# Patient Record
Sex: Male | Born: 1995 | Race: White | Hispanic: No | Marital: Single | Smoking: Never smoker
Health system: Southern US, Community
[De-identification: ages and names within clinical notes are randomized; demographics above are authoritative.]

---
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
## 2016-01-10 ENCOUNTER — Other Ambulatory Visit: Payer: Self-pay

## 2016-01-10 MED ORDER — BROMOCRIPTINE MESYLATE 2.5 MG PO TABS: 2.5000 mg | ORAL_TABLET | Freq: Every day | ORAL | 2 refills | Status: DC

## 2016-01-23 ENCOUNTER — Telehealth: Payer: Self-pay | Admitting: Endocrinology

## 2016-01-23 MED ORDER — BROMOCRIPTINE MESYLATE 2.5 MG PO TABS: 2.5000 mg | ORAL_TABLET | Freq: Every day | ORAL | 2 refills | Status: DC

## 2016-01-23 NOTE — Telephone Encounter (Signed)
Patient need refill of bromocriptine (PARLODEL) 2.5 MG tablet  Send to  [facility] [place] - [address] [phone] (Phone) [phone] (Fax)

## 2016-01-23 NOTE — Telephone Encounter (Signed)
Refill submitted per patient's request.  

## 2016-05-21 ENCOUNTER — Telehealth: Payer: Self-pay | Admitting: Endocrinology

## 2016-05-21 MED ORDER — SILDENAFIL CITRATE 100 MG PO TABS: 100.0000 mg | ORAL_TABLET | Freq: Every day | ORAL | 3 refills | Status: AC | PRN

## 2016-05-21 NOTE — Telephone Encounter (Signed)
viagra [facility] to be [facility] [place] [phone]

## 2016-05-21 NOTE — Telephone Encounter (Signed)
I have sent a prescription to your pharmacy  

## 2016-05-21 NOTE — Telephone Encounter (Signed)
See message and please advise. Medication is not on the current med list.

## 2016-05-22 NOTE — Telephone Encounter (Signed)
Refill of   sildenafil (VIAGRA) 100 MG tablet

## 2016-05-22 NOTE — Telephone Encounter (Signed)
Let pt know that [facility]

## 2016-05-22 NOTE — Telephone Encounter (Signed)
[facility] [place]

## 2016-06-03 ENCOUNTER — Other Ambulatory Visit: Payer: Self-pay

## 2016-06-03 MED ORDER — BROMOCRIPTINE MESYLATE 2.5 MG PO TABS: 2.5000 mg | ORAL_TABLET | Freq: Every day | ORAL | 2 refills | Status: DC

## 2016-08-14 ENCOUNTER — Telehealth: Payer: Self-pay | Admitting: Physical Medicine and Rehabilitation

## 2016-08-14 NOTE — Telephone Encounter (Signed)
Please refill x 1 Ov is due next available.

## 2016-08-14 NOTE — Telephone Encounter (Signed)
Patient is wanting to switch Viagra script because it is too expensive. Please advise, Thanks!

## 2016-08-14 NOTE — Telephone Encounter (Signed)
Patient is wanting to switch Viagra script b/c it's expensive.  [facility] [phone] - [place], [place] - [address]  Thank you,  -LL

## 2016-08-14 NOTE — Telephone Encounter (Signed)
Does pt mean he wants the 20 mg viagra?  If so, where does pt want me to send rx?

## 2016-08-15 ENCOUNTER — Other Ambulatory Visit: Payer: Self-pay | Admitting: Endocrinology

## 2016-08-15 NOTE — Telephone Encounter (Signed)
Please refill x 3 Ov is due 

## 2016-08-18 ENCOUNTER — Telehealth: Payer: Self-pay

## 2016-08-18 NOTE — Telephone Encounter (Signed)
please call patient: Cheapest is the 20 mg.  It is cheapest at [facility] or [facility].  Where do you want us to send rx?

## 2016-08-18 NOTE — Telephone Encounter (Signed)
[facility] TELEPHONE ADVICE RECORD [facility] Patient Name: Timothy Dawson Gender: Male DOB: 11/15/1995 Age: 21 Y 2 M 20 D Return Phone Number: [phone] (Primary) City/State/Zip: [place] [address] Client [facility] Client Site [facility] Who Is Calling Patient / Member / Family / Caregiver Call Type Triage / Clinical Relationship To Patient Self Return Phone Number [phone] (Primary) Chief Complaint Prescription Refill or Medication Request (non symptomatic) Reason for Call Medication Question / Request Initial Comment Caller states he is wanting to change a medication. Talked to the front desk yesterday and they said they would get it worked out but he is now at [facility] and they don't have the generic brand nor do they have any refills for the patient. Viagra, but wanting the generic brand because it is too expensive Appointment Disposition EMR Appointment Not Necessary Info pasted into Epic No Nurse Assessment Nurse: Whiteley, RN, Amber Date/Time (Eastern Time): 08/15/2016 5:02:07 PM Confirm and document reason for call. If symptomatic, describe symptoms. ---Caller states no symptoms. needing viagra changed to generic. states that office stated yesterday that this would be taken care of and not at the pharmacy. Please document clinical information provided and list any resource used. ---advised that this would be faxed to the office but to follow up with office monday or tuesday whenever office is open next. caller verbalized understanding Guidelines Guideline Title Affirmed Question Disp. Time (Eastern Time) Disposition Final User 08/15/2016 5:05:03 PM Clinical Call Yes Whiteley, RN, Amber Comments User: Amber, Whiteley, RN Date/Time (Eastern Time): 08/15/2016 5:01:54 PM Will need the medication sent to [facility] in [place] 

## 2016-08-19 NOTE — Telephone Encounter (Signed)
Left a vm requesting the patient call back to tell us where to send rx

## 2016-08-24 ENCOUNTER — Telehealth: Payer: Self-pay | Admitting: Endocrinology

## 2016-08-24 MED ORDER — SILDENAFIL CITRATE 20 MG PO TABS: 20.0000 mg | ORAL_TABLET | Freq: Three times a day (TID) | ORAL | 0 refills | Status: DC

## 2016-08-24 NOTE — Telephone Encounter (Signed)
Patient need a refill for Viagra generic brand.

## 2016-08-24 NOTE — Telephone Encounter (Signed)
Okay to submit refill? Thank you!

## 2016-08-24 NOTE — Telephone Encounter (Signed)
I sent rx x 1 Ov is due

## 2016-08-28 MED ORDER — SILDENAFIL CITRATE 20 MG PO TABS: 20.0000 mg | ORAL_TABLET | Freq: Three times a day (TID) | ORAL | 0 refills | Status: AC

## 2016-08-28 NOTE — Telephone Encounter (Signed)
Patient calling back to report that viagra generic was not received at pharm [id number] - [place], [place] - [address].   He is getting irrittated.  Please resend.  Thank you,  -LL

## 2016-08-28 NOTE — Telephone Encounter (Signed)
I have sent a prescription to the pharmacy.  F/u ov is due

## 2017-02-05 ENCOUNTER — Other Ambulatory Visit: Payer: Self-pay | Admitting: Endocrinology

## 2017-08-19 ENCOUNTER — Other Ambulatory Visit: Payer: Self-pay | Admitting: Endocrinology

## 2017-08-19 NOTE — Telephone Encounter (Signed)
Please refill x 1 Ov is due  

## 2017-08-19 NOTE — Telephone Encounter (Signed)
Last ov 08/23/15 1 no-show and no future scheduled- refill or refuse please advise

## 2017-11-23 ENCOUNTER — Other Ambulatory Visit: Payer: Self-pay | Admitting: Endocrinology

## 2020-03-27 ENCOUNTER — Other Ambulatory Visit: Payer: Managed Care, Other (non HMO)
